# Patient Record
Sex: Female | Born: 1955 | Race: White | Hispanic: No | Marital: Married | Smoking: Never smoker
Health system: Southern US, Community
[De-identification: ages and names within clinical notes are randomized; demographics above are authoritative.]

## PROBLEM LIST (undated history)

## (undated) DIAGNOSIS — E559 Vitamin D deficiency, unspecified: Secondary | ICD-10-CM

## (undated) DIAGNOSIS — I82411 Acute embolism and thrombosis of right femoral vein: Secondary | ICD-10-CM

## (undated) DIAGNOSIS — F32A Depression, unspecified: Secondary | ICD-10-CM

## (undated) DIAGNOSIS — T7840XA Allergy, unspecified, initial encounter: Secondary | ICD-10-CM

## (undated) DIAGNOSIS — F411 Generalized anxiety disorder: Secondary | ICD-10-CM

## (undated) DIAGNOSIS — E7800 Pure hypercholesterolemia, unspecified: Secondary | ICD-10-CM

## (undated) DIAGNOSIS — R7303 Prediabetes: Secondary | ICD-10-CM

## (undated) HISTORY — DX: Generalized anxiety disorder: F41.1

## (undated) HISTORY — DX: Allergy, unspecified, initial encounter: T78.40XA

## (undated) HISTORY — DX: Prediabetes: R73.03

## (undated) HISTORY — DX: Depression, unspecified: F32.A

## (undated) HISTORY — DX: Pure hypercholesterolemia, unspecified: E78.00

## (undated) HISTORY — PX: ABDOMINAL HYSTERECTOMY: SHX81

## (undated) HISTORY — PX: TOTAL HIP ARTHROPLASTY: SHX124

## (undated) HISTORY — DX: Vitamin D deficiency, unspecified: E55.9

## (undated) HISTORY — DX: Acute embolism and thrombosis of right femoral vein: I82.411

---

## 2012-12-02 ENCOUNTER — Ambulatory Visit (INDEPENDENT_AMBULATORY_CARE_PROVIDER_SITE_OTHER): Payer: Managed Care, Other (non HMO) | Admitting: Emergency Medicine

## 2012-12-02 ENCOUNTER — Ambulatory Visit: Payer: Managed Care, Other (non HMO)

## 2012-12-02 VITALS — BP 130/80 | HR 63 | Temp 98.5°F | Resp 18 | Ht 63.5 in | Wt 151.0 lb

## 2012-12-02 DIAGNOSIS — R05 Cough: Secondary | ICD-10-CM

## 2012-12-02 DIAGNOSIS — J209 Acute bronchitis, unspecified: Secondary | ICD-10-CM

## 2012-12-02 DIAGNOSIS — J018 Other acute sinusitis: Secondary | ICD-10-CM

## 2012-12-02 MED ORDER — HYDROCOD POLST-CHLORPHEN POLST 10-8 MG/5ML PO LQCR: 5.0000 mL | Freq: Two times a day (BID) | ORAL | Status: DC | PRN

## 2012-12-02 MED ORDER — PSEUDOEPHEDRINE-GUAIFENESIN ER 60-600 MG PO TB12: 1.0000 | ORAL_TABLET | Freq: Two times a day (BID) | ORAL | Status: AC

## 2012-12-02 MED ORDER — AMOXICILLIN-POT CLAVULANATE 875-125 MG PO TABS: 1.0000 | ORAL_TABLET | Freq: Two times a day (BID) | ORAL | Status: DC

## 2012-12-02 NOTE — Progress Notes (Signed)
[facility] [address] [place] [address] [phone]  Date:  12/02/2012   Name:  Breanna Lewis   DOB:  1955-06-24   MRN:  [id number]  PCP:  No primary provider on file.    Chief Complaint: Cough and Fatigue   History of Present Illness:  Breanna Lewis is a 57 y.o. very pleasant female patient who presents with the following:  One month duration of illness.  Treated previously for sinusitis with keflex and a shot of rocephin.  Initially had nasal congestion and post nasal drainage, fever and chills, and nonproductive cough.  Worse at night.  No nausea or vomiting.  No wheezing or shortness of breath.  Has persistent cough.  Lack of energy, myalgia and athralgias.  No improvement with over the counter medications or other home remedies. Denies other complaint or health concern today.   There are no active problems to display for this patient.   Past Medical History  Diagnosis Date  . Allergy     Past Surgical History  Procedure Laterality Date  . Abdominal hysterectomy      History  Substance Use Topics  . Smoking status: Never Smoker   . Smokeless tobacco: Not on file  . Alcohol Use: No    No family history on file.  No Known Allergies  Medication list has been reviewed and updated.  No current outpatient prescriptions on file prior to visit.   No current facility-administered medications on file prior to visit.    Review of Systems:  As per HPI, otherwise negative.    Physical Examination: Filed Vitals:   12/02/12 1025  BP: 130/80  Pulse: 63  Temp: 98.5 F (36.9 C)  Resp: 18   Filed Vitals:   12/02/12 1025  Height: 5' 3.5" (1.613 m)  Weight: 151 lb (68.493 kg)   Body mass index is 26.33 kg/(m^2). Ideal Body Weight: Weight in (lb) to have BMI = 25: 143.1  GEN: WDWN, NAD, Non-toxic, A & O x 3 HEENT: Atraumatic, Normocephalic. Neck supple. No masses, No LAD. Ears and Nose: No external deformity. CV: RRR, No M/G/R. No  JVD. No thrill. No extra heart sounds. PULM: CTA B, no wheezes, crackles, rhonchi. No retractions. No resp. distress. No accessory muscle use. ABD: S, NT, ND, +BS. No rebound. No HSM. EXTR: No c/c/e NEURO Normal gait.  PSYCH: Normally interactive. Conversant. Not depressed or anxious appearing.  Calm demeanor.    Assessment and Plan: Sinusitis Bronchitis augmentin mucinex tussionex   Signed,  Phillips Odor, MD   [facility] reading (PRIMARY) by  Dr. Dareen Piano.  Chest negative.

## 2012-12-02 NOTE — Patient Instructions (Signed)

## 2017-09-21 ENCOUNTER — Encounter (HOSPITAL_COMMUNITY): Payer: Self-pay | Admitting: Emergency Medicine

## 2017-09-21 ENCOUNTER — Emergency Department (HOSPITAL_COMMUNITY): Payer: BLUE CROSS/BLUE SHIELD

## 2017-09-21 ENCOUNTER — Emergency Department (HOSPITAL_COMMUNITY)
Admission: EM | Admit: 2017-09-21 | Discharge: 2017-09-21 | Disposition: A | Discharge status: Home / Self Care | Payer: BLUE CROSS/BLUE SHIELD | Attending: Emergency Medicine | Admitting: Emergency Medicine

## 2017-09-21 ENCOUNTER — Other Ambulatory Visit: Payer: Self-pay

## 2017-09-21 DIAGNOSIS — R42 Dizziness and giddiness: Secondary | ICD-10-CM

## 2017-09-21 DIAGNOSIS — R739 Hyperglycemia, unspecified: Secondary | ICD-10-CM | POA: Diagnosis not present

## 2017-09-21 DIAGNOSIS — R109 Unspecified abdominal pain: Secondary | ICD-10-CM | POA: Diagnosis not present

## 2017-09-21 LAB — URINALYSIS, ROUTINE W REFLEX MICROSCOPIC
Bilirubin Urine: NEGATIVE
Glucose, UA: 50 mg/dL — AB
HGB URINE DIPSTICK: NEGATIVE
Ketones, ur: NEGATIVE mg/dL
Leukocytes, UA: NEGATIVE
NITRITE: NEGATIVE
PROTEIN: NEGATIVE mg/dL
Specific Gravity, Urine: 1.004 — ABNORMAL LOW (ref 1.005–1.030)
pH: 7 (ref 5.0–8.0)

## 2017-09-21 LAB — CBG MONITORING, ED: GLUCOSE-CAPILLARY: 156 mg/dL — AB (ref 70–99)

## 2017-09-21 LAB — BASIC METABOLIC PANEL
Anion gap: 8 (ref 5–15)
BUN: 11 mg/dL (ref 8–23)
CALCIUM: 9.2 mg/dL (ref 8.9–10.3)
CO2: 28 mmol/L (ref 22–32)
CREATININE: 0.73 mg/dL (ref 0.44–1.00)
Chloride: 105 mmol/L (ref 98–111)
GFR calc Af Amer: >60 mL/min (ref >60)
Glucose, Bld: 183 mg/dL — ABNORMAL HIGH (ref 70–99)
Potassium: 3.7 mmol/L (ref 3.5–5.1)
SODIUM: 141 mmol/L (ref 135–145)

## 2017-09-21 LAB — CBC
HCT: 45.7 % (ref 36.0–46.0)
Hemoglobin: 15 g/dL (ref 12.0–15.0)
MCH: 31.3 pg (ref 26.0–34.0)
MCHC: 32.8 g/dL (ref 30.0–36.0)
MCV: 95.2 fL (ref 78.0–100.0)
PLATELETS: 201 10*3/uL (ref 150–400)
RBC: 4.8 MIL/uL (ref 3.87–5.11)
RDW: 13.3 % (ref 11.5–15.5)
WBC: 7.8 10*3/uL (ref 4.0–10.5)

## 2017-09-21 MED ORDER — SODIUM CHLORIDE 0.9 % IV SOLN
INTRAVENOUS | Status: DC
  Administered 2017-09-21: 12:00:00 via INTRAVENOUS

## 2017-09-21 MED ORDER — MECLIZINE HCL 25 MG PO TABS
25.0000 mg | ORAL_TABLET | Freq: Once | ORAL | Status: AC
  Administered 2017-09-21: 25 mg via ORAL
  Filled 2017-09-21: qty 1

## 2017-09-21 MED ORDER — ONDANSETRON HCL 4 MG/2ML IJ SOLN
4.0000 mg | Freq: Once | INTRAMUSCULAR | Status: AC
  Administered 2017-09-21: 4 mg via INTRAVENOUS
  Filled 2017-09-21: qty 2

## 2017-09-21 NOTE — Discharge Instructions (Addendum)
Drink plenty of fluids Call [facility] tomorrow- you may request [place] location, if it is more convenient Recheck blood sugar with doctor this week

## 2017-09-21 NOTE — ED Triage Notes (Addendum)
Patient here from home with complaints of vertigo. Nausea, vomiting. Vertigo, constant dizziness.

## 2017-09-21 NOTE — ED Provider Notes (Signed)
[facility]-EMERGENCY DEPT Provider Note   CSN: [id number] Arrival date & time: 09/21/17  4098     History   Chief Complaint Chief Complaint  Patient presents with  . Nausea  . Emesis  . Dizziness    HPI Breanna Lewis is a 62 y.o. female.  HPI 61 year old female history of renal colic, ago presents today with onset of dizziness that began while she was eating at [facility].  She vomited.  She continues to complain of some spinning and dizziness feeling off balance.  She denies any visual changes, lateralized weakness, or difficulty with gait.  She has had 2 prior episodes this year.  She reports being seen for follow-up at urgent care.  She has not had any other specialist follow-up but has not been seen by her primary care doctor for this.  Reports prior problems with her left ear as being the source of her problems. She reports a 2-week history of left flank pain.  It is waxing and waning.  She identifies this is similar to pain with her previous kidney stone.  She denies any pain for urination, blood in her urine, or frequency of urination.  Denies fever or chills. Past Medical History:  Diagnosis Date  . Allergy     There are no active problems to display for this patient.   Past Surgical History:  Procedure Laterality Date  . ABDOMINAL HYSTERECTOMY       OB History   None      Home Medications    Prior to Admission medications   Medication Sig Start Date End Date Taking? Authorizing Provider  ALPRAZolam (XANAX XR) 2 MG 24 hr tablet Take 2 mg by mouth 2 (two) times daily. 09/10/17  Yes [provider]  morphine (MS CONTIN) 15 MG 12 hr tablet Take 15 mg by mouth every 12 (twelve) hours.   Yes [provider]  phentermine 15 MG capsule Take 15 mg by mouth every morning. 09/09/17  Yes [provider]    Family History No family history on file.  Social History Social History   Tobacco Use  . Smoking status: Never  Smoker  . Smokeless tobacco: Never Used  Substance Use Topics  . Alcohol use: No  . Drug use: No     Allergies   Patient has no known allergies.   Review of Systems Review of Systems  All other systems reviewed and are negative.    Physical Exam Updated Vital Signs BP 130/87 (BP Location: Right Arm)   Pulse (!) 54   Resp 15   Ht 1.626 m (5\' 4" )   Wt 55.8 kg (123 lb)   SpO2 100%   BMI 21.11 kg/m   Physical Exam  Constitutional: She is oriented to person, place, and time. She appears well-developed and well-nourished.  HENT:  Head: Normocephalic and atraumatic.  Right Ear: External ear normal.  Left Ear: External ear normal.  Eyes: Pupils are equal, round, and reactive to light.  Neck: Normal range of motion. Neck supple.  Cardiovascular: Normal rate, regular rhythm and normal heart sounds.  Pulmonary/Chest: Effort normal and breath sounds normal.  Abdominal: Soft. Bowel sounds are normal.  Musculoskeletal: Normal range of motion.  Neurological: She is alert and oriented to person, place, and time. She displays normal reflexes. No cranial nerve deficit or sensory deficit. She exhibits normal muscle tone. Coordination normal.  Skin: Skin is warm. Capillary refill takes less than 2 seconds.  Psychiatric: She has a  normal mood and affect.  Nursing note and vitals reviewed.    ED Treatments / Results  Labs (all labs ordered are listed, but only abnormal results are displayed) Labs Reviewed  CBG MONITORING, ED - Abnormal; Notable for the following components:      Result Value   Glucose-Capillary 156 (*)    All other components within normal limits  BASIC METABOLIC PANEL  CBC  URINALYSIS, ROUTINE W REFLEX MICROSCOPIC    EKG None  Radiology Ct Head Wo Contrast  Result Date: 09/21/2017 CLINICAL DATA:  Vertigo.  Nausea. EXAM: CT HEAD WITHOUT CONTRAST TECHNIQUE: Contiguous axial images were obtained from the base of the skull through the vertex without  intravenous contrast. COMPARISON:  None. FINDINGS: Brain: No evidence of acute infarction, hemorrhage, hydrocephalus, extra-axial collection or mass lesion/mass effect. Vascular: No hyperdense vessel or unexpected calcification. Skull: Normal. Negative for fracture or focal lesion. Sinuses/Orbits: No acute finding. Other: None. IMPRESSION: 1. Normal appearance of the brain for age. No acute intracranial abnormalities noted. Electronically Signed   By: Signa Kellaylor  Stroud M.D.   On: 09/21/2017 12:28   Ct Renal Stone Study  Result Date: 09/21/2017 CLINICAL DATA:  Bilateral flank pain and vomiting. EXAM: CT ABDOMEN AND PELVIS WITHOUT CONTRAST TECHNIQUE: Multidetector CT imaging of the abdomen and pelvis was performed following the standard protocol without IV contrast. COMPARISON:  CT abdomen pelvis dated June 11, 2017. FINDINGS: Lower chest: No acute abnormality.  Bibasilar atelectasis. Hepatobiliary: No focal liver abnormality is seen. No gallstones, gallbladder wall thickening, or biliary dilatation. Pancreas: Unremarkable. No pancreatic ductal dilatation or surrounding inflammatory changes. Spleen: Normal in size without focal abnormality. Adrenals/Urinary Tract: Adrenal glands are unremarkable. Kidneys are normal, without renal calculi, focal lesion, or hydronephrosis. Bladder is unremarkable. Stomach/Bowel: Stomach is within normal limits. Appendix appears normal. No evidence of bowel wall thickening, distention, or inflammatory changes. Vascular/Lymphatic: No significant vascular findings are present. No enlarged abdominal or pelvic lymph nodes. Reproductive: Status post hysterectomy. No adnexal masses. Other: No free fluid or pneumoperitoneum. Musculoskeletal: No acute or significant osseous findings. Prior bilateral total hip arthroplasties. Moderate facet arthropathy at L4-L5 and L5-S1 with trace L4-L5 anterolisthesis. IMPRESSION: 1.  No acute intra-abdominal process.  No renal or ureteral calculi.  Electronically Signed   By: Obie DredgeWilliam T Derry M.D.   On: 09/21/2017 12:36    Procedures Procedures (including critical care time)  Medications Ordered in ED Medications - No data to display   Initial Impression / Assessment and Plan / ED Course  I have reviewed the triage vital signs and the nursing notes.  Pertinent labs & imaging results that were available during my care of the patient were reviewed by me and considered in my medical decision making (see chart for details).     62 yo female with flank pain and dizziness.  NOrmal neuro exam here. HR low normal with normal bp CT head and abdomen without abnormality Plan checkurine Will give referral to cardiology for bradycardia Discussed return precautions and need for follow up Final Clinical Impressions(s) / ED Diagnoses   Final diagnoses:  Vertigo  Flank pain  Hyperglycemia    ED Discharge Orders    None       Margarita Grizzleay, Cage Gupton, MD 09/21/17 [id number]

## 2018-05-03 ENCOUNTER — Other Ambulatory Visit: Payer: Self-pay

## 2018-05-03 ENCOUNTER — Encounter (HOSPITAL_BASED_OUTPATIENT_CLINIC_OR_DEPARTMENT_OTHER): Payer: Self-pay | Admitting: Emergency Medicine

## 2018-05-03 ENCOUNTER — Emergency Department (HOSPITAL_BASED_OUTPATIENT_CLINIC_OR_DEPARTMENT_OTHER)
Admission: EM | Admit: 2018-05-03 | Discharge: 2018-05-03 | Disposition: A | Discharge status: Home / Self Care | Payer: Medicaid - Out of State | Attending: Emergency Medicine | Admitting: Emergency Medicine

## 2018-05-03 DIAGNOSIS — A09 Infectious gastroenteritis and colitis, unspecified: Secondary | ICD-10-CM | POA: Diagnosis not present

## 2018-05-03 DIAGNOSIS — K2900 Acute gastritis without bleeding: Secondary | ICD-10-CM | POA: Diagnosis not present

## 2018-05-03 DIAGNOSIS — R140 Abdominal distension (gaseous): Secondary | ICD-10-CM | POA: Diagnosis present

## 2018-05-03 DIAGNOSIS — E860 Dehydration: Secondary | ICD-10-CM | POA: Insufficient documentation

## 2018-05-03 LAB — GASTROINTESTINAL PANEL BY PCR, STOOL (REPLACES STOOL CULTURE)
Adenovirus F40/41: NOT DETECTED
Astrovirus: NOT DETECTED
CYCLOSPORA CAYETANENSIS: NOT DETECTED
Campylobacter species: NOT DETECTED
Cryptosporidium: NOT DETECTED
ENTEROPATHOGENIC E COLI (EPEC): NOT DETECTED
ENTEROTOXIGENIC E COLI (ETEC): NOT DETECTED
Entamoeba histolytica: NOT DETECTED
Enteroaggregative E coli (EAEC): NOT DETECTED
Giardia lamblia: NOT DETECTED
NOROVIRUS GI/GII: NOT DETECTED
Plesimonas shigelloides: NOT DETECTED
Rotavirus A: DETECTED — AB
SAPOVIRUS (I, II, IV, AND V): NOT DETECTED
SHIGELLA/ENTEROINVASIVE E COLI (EIEC): NOT DETECTED
Salmonella species: NOT DETECTED
Shiga like toxin producing E coli (STEC): NOT DETECTED
Vibrio cholerae: NOT DETECTED
Vibrio species: NOT DETECTED
Yersinia enterocolitica: NOT DETECTED

## 2018-05-03 LAB — CBC
HCT: 50.9 % — ABNORMAL HIGH (ref 36.0–46.0)
Hemoglobin: 16.2 g/dL — ABNORMAL HIGH (ref 12.0–15.0)
MCH: 30.8 pg (ref 26.0–34.0)
MCHC: 31.8 g/dL (ref 30.0–36.0)
MCV: 96.8 fL (ref 80.0–100.0)
NRBC: 0 % (ref 0.0–0.2)
Platelets: 160 10*3/uL (ref 150–400)
RBC: 5.26 MIL/uL — ABNORMAL HIGH (ref 3.87–5.11)
RDW: 13 % (ref 11.5–15.5)
WBC: 5.2 10*3/uL (ref 4.0–10.5)

## 2018-05-03 LAB — COMPREHENSIVE METABOLIC PANEL
ALK PHOS: 114 U/L (ref 38–126)
ALT: 31 U/L (ref 0–44)
AST: 24 U/L (ref 15–41)
Albumin: 3.8 g/dL (ref 3.5–5.0)
Anion gap: 7 (ref 5–15)
BILIRUBIN TOTAL: 0.6 mg/dL (ref 0.3–1.2)
BUN: 14 mg/dL (ref 8–23)
CHLORIDE: 102 mmol/L (ref 98–111)
CO2: 27 mmol/L (ref 22–32)
CREATININE: 0.77 mg/dL (ref 0.44–1.00)
Calcium: 8.6 mg/dL — ABNORMAL LOW (ref 8.9–10.3)
GFR calc Af Amer: >60 mL/min (ref >60)
GFR calc non Af Amer: >60 mL/min (ref >60)
Glucose, Bld: 90 mg/dL (ref 70–99)
Potassium: 3.6 mmol/L (ref 3.5–5.1)
Sodium: 136 mmol/L (ref 135–145)
Total Protein: 6.5 g/dL (ref 6.5–8.1)

## 2018-05-03 LAB — LIPASE, BLOOD: Lipase: 33 U/L (ref 11–51)

## 2018-05-03 MED ORDER — ONDANSETRON HCL 4 MG PO TABS: 4.0000 mg | ORAL_TABLET | Freq: Three times a day (TID) | ORAL | 0 refills | Status: DC | PRN

## 2018-05-03 MED ORDER — DIPHENOXYLATE-ATROPINE 2.5-0.025 MG PO TABS
2.0000 | ORAL_TABLET | Freq: Once | ORAL | Status: AC
  Administered 2018-05-03: 2 via ORAL
  Filled 2018-05-03: qty 2

## 2018-05-03 MED ORDER — LIDOCAINE VISCOUS HCL 2 % MT SOLN: 15.0000 mL | OROMUCOSAL | 0 refills | Status: DC | PRN

## 2018-05-03 MED ORDER — CIPROFLOXACIN HCL 500 MG PO TABS
500.0000 mg | ORAL_TABLET | Freq: Once | ORAL | Status: AC
  Administered 2018-05-03: 500 mg via ORAL
  Filled 2018-05-03: qty 1

## 2018-05-03 MED ORDER — ONDANSETRON HCL 4 MG/2ML IJ SOLN
4.0000 mg | Freq: Once | INTRAMUSCULAR | Status: AC
  Administered 2018-05-03: 4 mg via INTRAVENOUS
  Filled 2018-05-03: qty 2

## 2018-05-03 MED ORDER — DIPHENOXYLATE-ATROPINE 2.5-0.025 MG PO TABS: 2.0000 | ORAL_TABLET | Freq: Four times a day (QID) | ORAL | 0 refills | Status: DC | PRN

## 2018-05-03 MED ORDER — LACTATED RINGERS IV BOLUS
1000.0000 mL | Freq: Once | INTRAVENOUS | Status: AC
  Administered 2018-05-03: 1000 mL via INTRAVENOUS

## 2018-05-03 MED ORDER — SODIUM CHLORIDE 0.9% FLUSH
3.0000 mL | Freq: Once | INTRAVENOUS | Status: DC
  Filled 2018-05-03: qty 3

## 2018-05-03 MED ORDER — DIPHENOXYLATE-ATROPINE 2.5-0.025 MG PO TABS
1.0000 | ORAL_TABLET | Freq: Once | ORAL | Status: AC
  Administered 2018-05-03: 1 via ORAL
  Filled 2018-05-03: qty 1

## 2018-05-03 MED ORDER — CIPROFLOXACIN HCL 500 MG PO TABS: 500.0000 mg | ORAL_TABLET | Freq: Two times a day (BID) | ORAL | 0 refills | Status: DC

## 2018-05-03 MED ORDER — FAMOTIDINE 20 MG PO TABS: 20.0000 mg | ORAL_TABLET | Freq: Two times a day (BID) | ORAL | 0 refills | Status: DC

## 2018-05-03 NOTE — ED Provider Notes (Signed)
Emergency Department Provider Note   I have reviewed the triage vital signs and the nursing notes.   HISTORY  Chief Complaint Abdominal Pain   HPI Gene Dajani is a 63 y.o. female with PMH as documented below who presents to the ED with 3 days of worsening abdominal distension, diarrhea and nausea. No fever or rash. Her son was recently diagnosed with schistosomiasis. No other associated symptoms. Tried pepto without relief.   No other associated or modifying symptoms.    Past Medical History:  Diagnosis Date  . Allergy     There are no active problems to display for this patient.   Past Surgical History:  Procedure Laterality Date  . ABDOMINAL HYSTERECTOMY      Current Outpatient Rx  . Order #: [id number] Class: Historical Med  . Order #: [id number] Class: Print  . Order #: [id number] Class: Print  . Order #: [id number] Class: Print  . Order #: [id number] Class: Print  . Order #: [id number] Class: Historical Med  . Order #: [id number] Class: Print  . Order #: [id number] Class: Historical Med    Allergies Patient has no known allergies.  No family history on file.  Social History Social History   Tobacco Use  . Smoking status: Never Smoker  . Smokeless tobacco: Never Used  Substance Use Topics  . Alcohol use: No  . Drug use: No    Review of Systems  All other systems negative except as documented in the HPI. All pertinent positives and negatives as reviewed in the HPI. ____________________________________________   PHYSICAL EXAM:  VITAL SIGNS: ED Triage Vitals  Enc Vitals Group     BP 05/03/18 0222 (!) 140/96     Pulse Rate 05/03/18 0222 78     Resp 05/03/18 0222 18     Temp 05/03/18 0222 98 F (36.7 C)     Temp Source 05/03/18 0222 Oral     SpO2 05/03/18 0222 100 %     Weight 05/03/18 0223 125 lb (56.7 kg)     Height 05/03/18 0223 5\' 4"  (1.626 m)    Constitutional: Alert and oriented. Well appearing and in no acute distress. Eyes: Conjunctivae  are normal. PERRL. EOMI. Head: Atraumatic. Nose: No congestion/rhinnorhea. Mouth/Throat: Mucous membranes are moist.  Oropharynx non-erythematous. Neck: No stridor.  No meningeal signs.   Cardiovascular: Normal rate, regular rhythm. Good peripheral circulation. Grossly normal heart sounds.   Respiratory: Normal respiratory effort.  No retractions. Lungs CTAB. Gastrointestinal: Soft and nontender. Mild distention.  Musculoskeletal: No lower extremity tenderness nor edema. No gross deformities of extremities. Neurologic:  Normal speech and language. No gross focal neurologic deficits are appreciated.  Skin:  Skin is warm, dry and intact. No rash noted.   ____________________________________________   LABS (all labs ordered are listed, but only abnormal results are displayed)  Labs Reviewed  COMPREHENSIVE METABOLIC PANEL - Abnormal; Notable for the following components:      Result Value   Calcium 8.6 (*)    All other components within normal limits  CBC - Abnormal; Notable for the following components:   RBC 5.26 (*)    Hemoglobin 16.2 (*)    HCT 50.9 (*)    All other components within normal limits  OVA + PARASITE EXAM  GASTROINTESTINAL PANEL BY PCR, STOOL (REPLACES STOOL CULTURE)  LIPASE, BLOOD  URINALYSIS, ROUTINE W REFLEX MICROSCOPIC   ____________________________________________   INITIAL IMPRESSION / ASSESSMENT AND PLAN / ED COURSE  Dry on exam. hemoconcentrated on labs. Will get stool sample. Treat for bacterial infection in  lieu of results. Iv fluids/zofran. Hold on diarrhea meds for time being.   After approximately 15 episodes of diarrhea, lomotil seems to be helping. Will treat for reflux/infectious diarrhea. pcp follow up. Pending parasite/bacterial studies.   Pertinent labs & imaging results that were available during my care of the patient were reviewed by me and considered in my medical decision making (see chart for  details).  ____________________________________________  FINAL CLINICAL IMPRESSION(S) / ED DIAGNOSES  Final diagnoses:  Acute gastritis without hemorrhage, unspecified gastritis type  Diarrhea of infectious origin  Dehydration     MEDICATIONS GIVEN DURING THIS VISIT:  Medications  sodium chloride flush (NS) 0.9 % injection 3 mL (3 mLs Intravenous Not Given 05/03/18 0433)  ciprofloxacin (CIPRO) tablet 500 mg (has no administration in time range)  lactated ringers bolus 1,000 mL (1,000 mLs Intravenous New Bag/Given 05/03/18 0455)  ondansetron (ZOFRAN) injection 4 mg (4 mg Intravenous Given 05/03/18 0446)  diphenoxylate-atropine (LOMOTIL) 2.5-0.025 MG per tablet 1 tablet (1 tablet Oral Given 05/03/18 0504)  diphenoxylate-atropine (LOMOTIL) 2.5-0.025 MG per tablet 2 tablet (2 tablets Oral Given 05/03/18 0553)     NEW OUTPATIENT MEDICATIONS STARTED DURING THIS VISIT:  New Prescriptions   CIPROFLOXACIN (CIPRO) 500 MG TABLET    Take 1 tablet (500 mg total) by mouth 2 (two) times daily.   DIPHENOXYLATE-ATROPINE (LOMOTIL) 2.5-0.025 MG TABLET    Take 2 tablets by mouth 4 (four) times daily as needed for diarrhea or loose stools.   FAMOTIDINE (PEPCID) 20 MG TABLET    Take 1 tablet (20 mg total) by mouth 2 (two) times daily.   LIDOCAINE (XYLOCAINE) 2 % SOLUTION    Use as directed 15 mLs in the mouth or throat as needed for mouth pain.   ONDANSETRON (ZOFRAN) 4 MG TABLET    Take 1 tablet (4 mg total) by mouth every 8 (eight) hours as needed for nausea or vomiting.    Note:  This note was prepared with assistance of Dragon voice recognition software. Occasional wrong-word or sound-a-like substitutions may have occurred due to the inherent limitations of voice recognition software.   Deisy Ozbun, Barbara Cower, MD 05/03/18 [phone]

## 2018-05-03 NOTE — ED Triage Notes (Signed)
Pt c/o 10/10 abd pain with nausea and multiple loose stool since yesterday, denies vomiting, fever, chills or any urinary symptoms.

## 2018-05-03 NOTE — ED Notes (Signed)
Pt given BSC due to inability to hold her bowels en route to bathroom.

## 2018-05-03 NOTE — ED Notes (Signed)
Pt reports 2 days ago she started having diarrhea. Became worse today, multiple episodes associated with nausea and generalized abdominal cramping. Denies fevers. Reports her son was also sick with GI symptoms. Reporting watery diarrhea without evidence of blood. No recent antibiotics.

## 2018-05-07 LAB — OVA + PARASITE EXAM

## 2018-05-07 LAB — O&P RESULT

## 2019-07-16 IMAGING — CT CT RENAL STONE PROTOCOL
2 of 4 series · 16 of 46 positions shown, 18 images · non-contrast
Comparison: CT abdomen pelvis dated June 11, 2017.

CLINICAL DATA: Bilateral flank pain and vomiting.

EXAM:
CT ABDOMEN AND PELVIS WITHOUT CONTRAST
TECHNIQUE: Multidetector CT imaging of the abdomen and pelvis was performed
following the standard protocol without IV contrast.

[Series 2: axial st · axial · 0.80mm/px · z∈[-88,+346]mm · 13 of 99 slices shown, 15 images]
[im 6/99  soft-tissue]
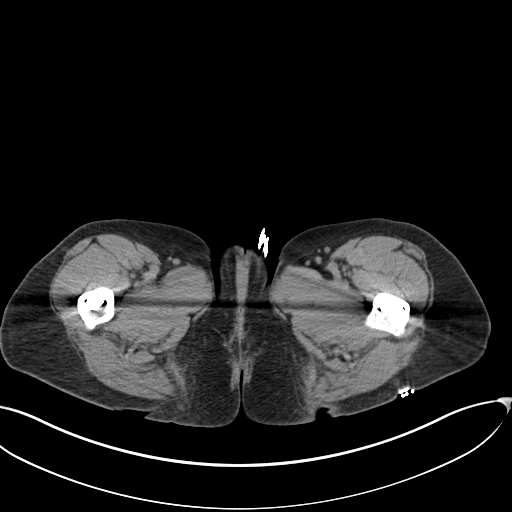
[im 6/99  bone]
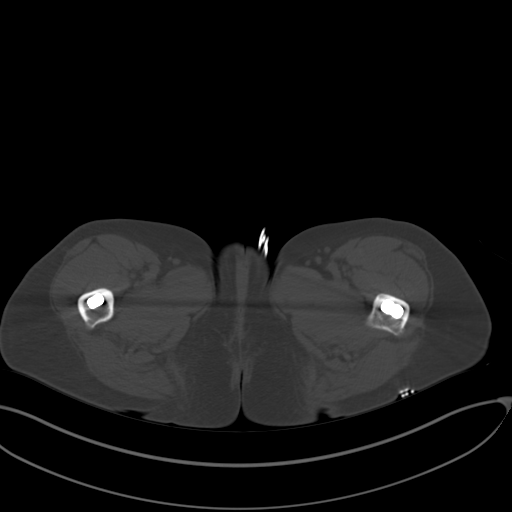
[im 11/99  soft-tissue]
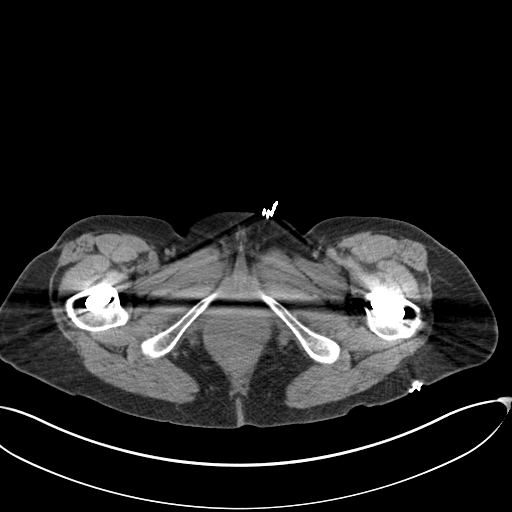
[im 22/99  soft-tissue]
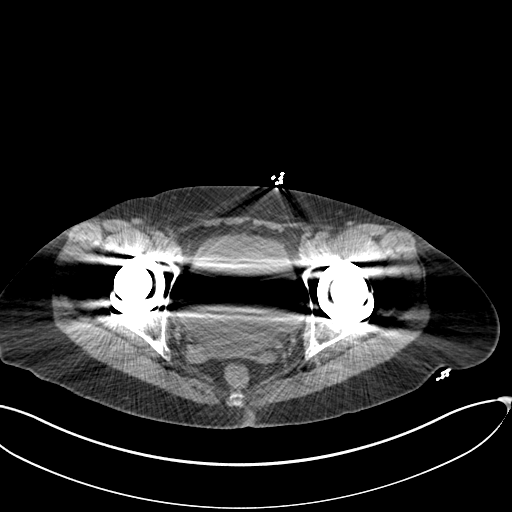
[im 28/99  soft-tissue]
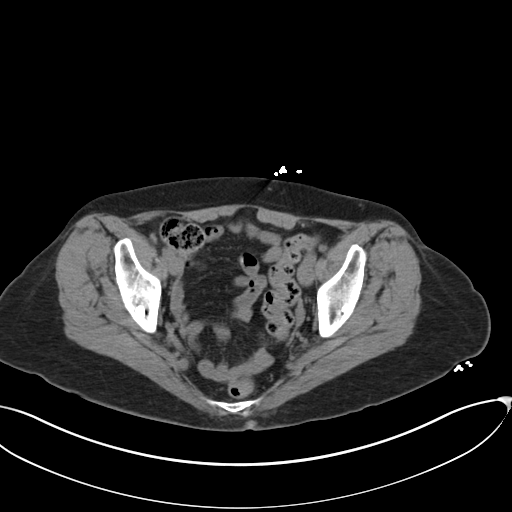
[im 33/99  soft-tissue]
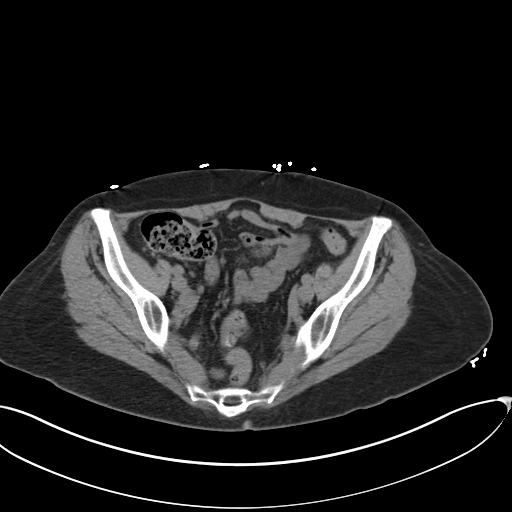
[im 44/99  soft-tissue]
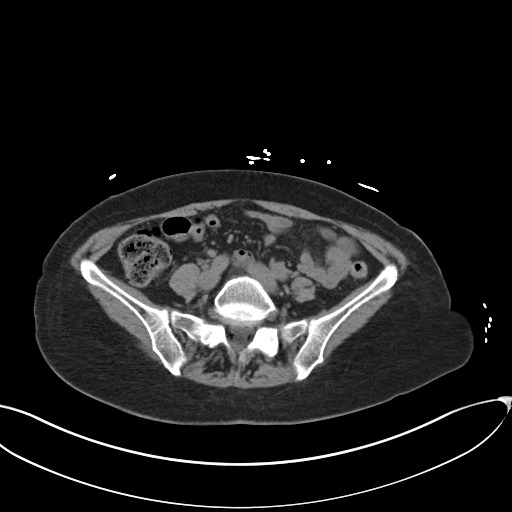
[im 50/99  soft-tissue]
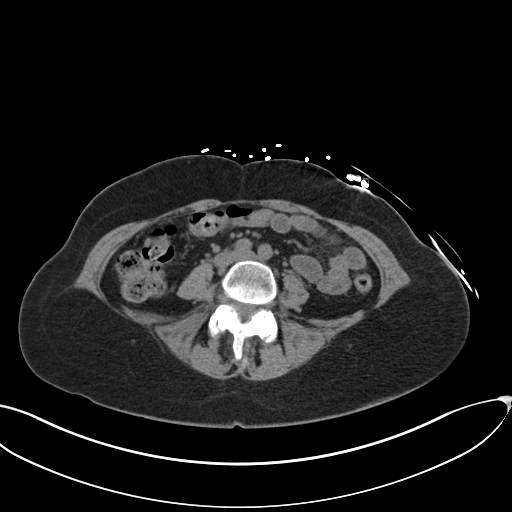
[im 55/99  soft-tissue]
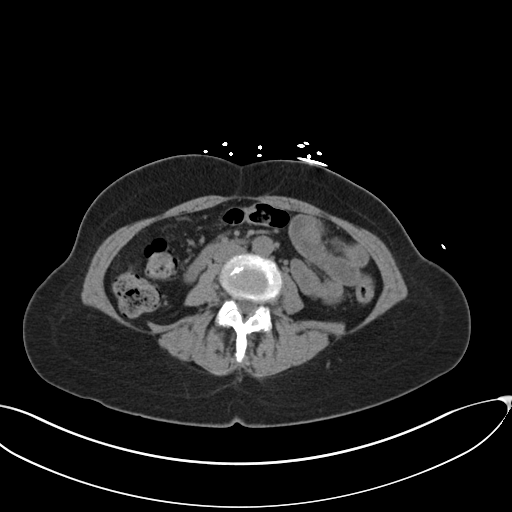
[im 66/99  soft-tissue]
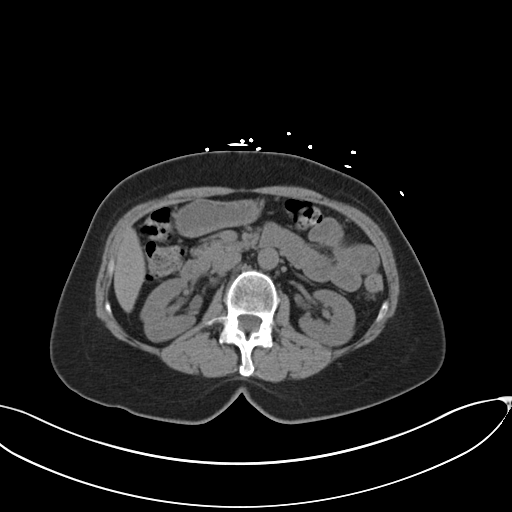
[im 66/99  bone]
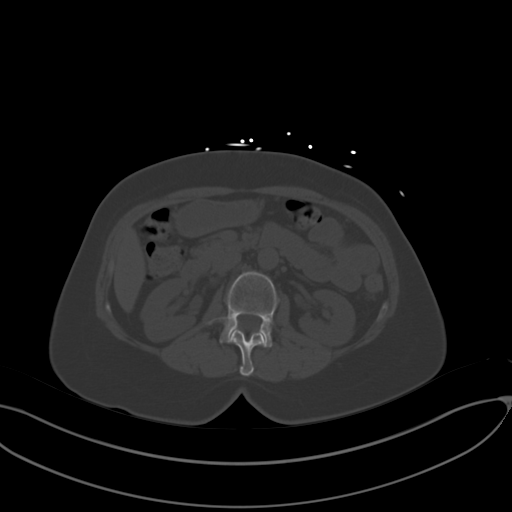
[im 71/99  soft-tissue]
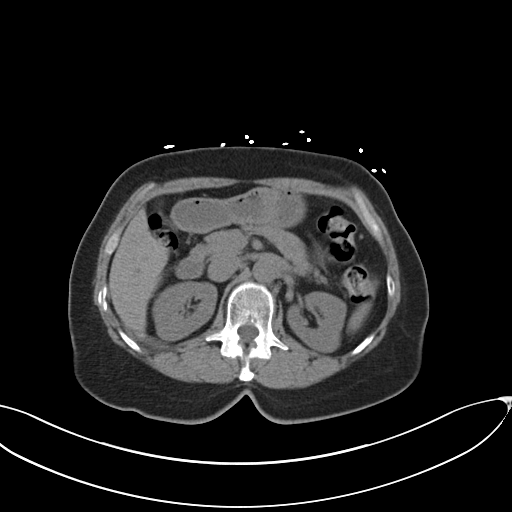
[im 77/99  soft-tissue]
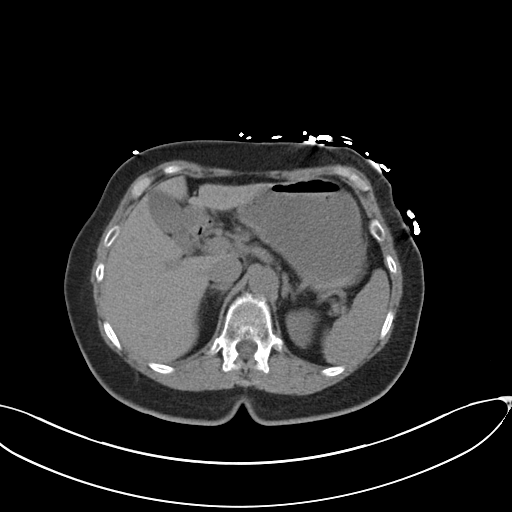
[im 88/99  soft-tissue]
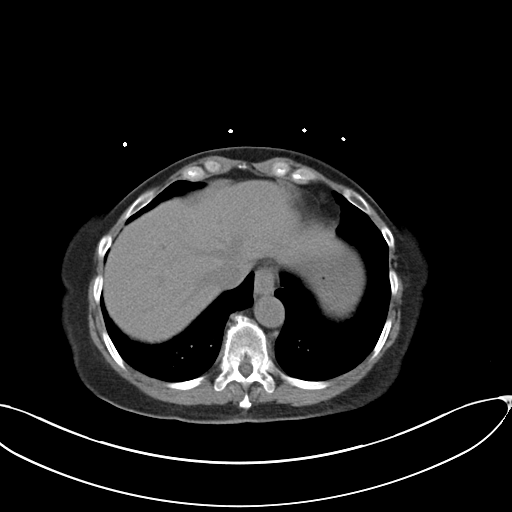
[im 93/99  soft-tissue]
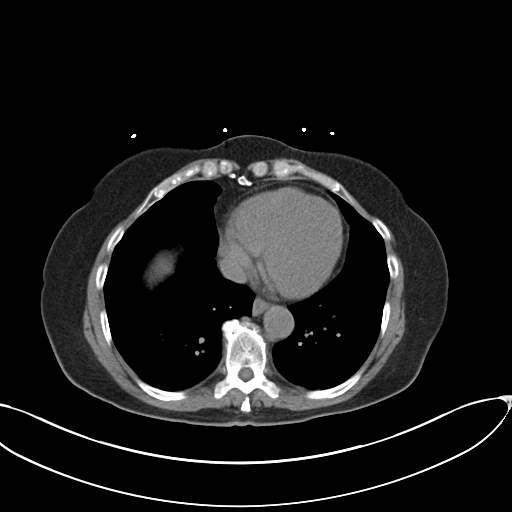

[Series 4: coronal · coronal · 0.83mm/px · 3 of 146 slices shown]
[im 49/146  soft-tissue]
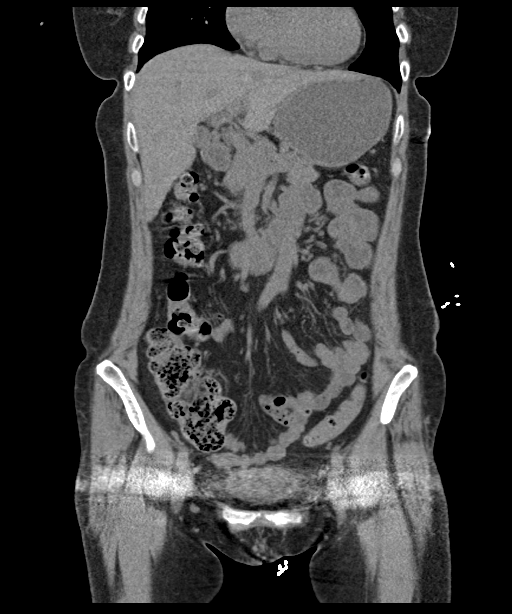
[im 65/146  soft-tissue]
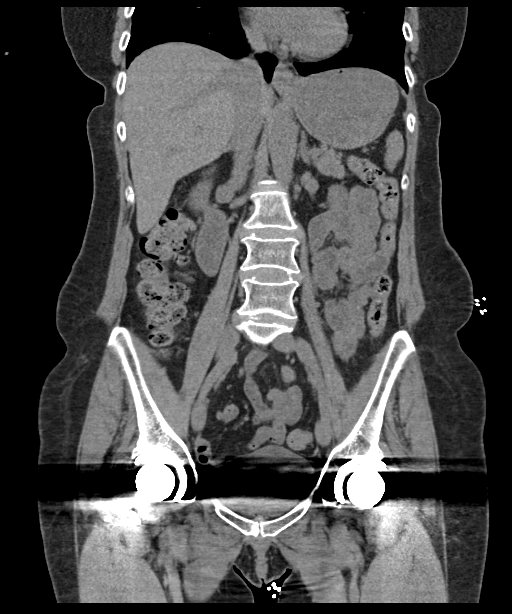
[im 81/146  soft-tissue]
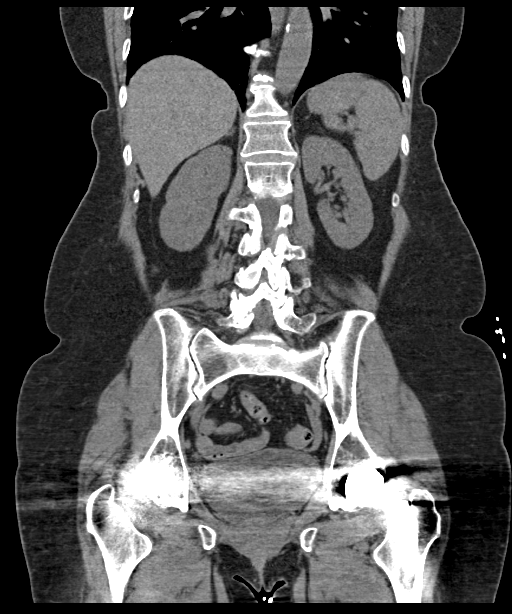

[16 of 46 positions shown; findings below may reference images not displayed]

FINDINGS: Lower chest: No acute abnormality.  Bibasilar atelectasis.

Hepatobiliary: No focal liver abnormality is seen. No gallstones,
gallbladder wall thickening, or biliary dilatation.

Pancreas: Unremarkable. No pancreatic ductal dilatation or
surrounding inflammatory changes.

Spleen: Normal in size without focal abnormality.

Adrenals/Urinary Tract: Adrenal glands are unremarkable. Kidneys are
normal, without renal calculi, focal lesion, or hydronephrosis.
Bladder is unremarkable.

Stomach/Bowel: Stomach is within normal limits. Appendix appears
normal. No evidence of bowel wall thickening, distention, or
inflammatory changes.

Vascular/Lymphatic: No significant vascular findings are present. No
enlarged abdominal or pelvic lymph nodes.

Reproductive: Status post hysterectomy. No adnexal masses.

Other: No free fluid or pneumoperitoneum.

Musculoskeletal: No acute or significant osseous findings. Prior
bilateral total hip arthroplasties. Moderate facet arthropathy at
L4-L5 and L5-S1 with trace L4-L5 anterolisthesis.
IMPRESSION: 1.  No acute intra-abdominal process.  No renal or ureteral calculi.

## 2019-07-16 IMAGING — CT CT HEAD W/O CM
3 series · 14 of 47 positions shown, 16 images · non-contrast
Comparison: None.

CLINICAL DATA: Vertigo.  Nausea.

EXAM:
CT HEAD WITHOUT CONTRAST
TECHNIQUE: Contiguous axial images were obtained from the base of the skull
through the vertex without intravenous contrast.

[Series 2: head wo · axial · 0.47mm/px · z∈[+647,+772]mm · 8 of 30 slices shown, 10 images]
[im 3/30  brain]
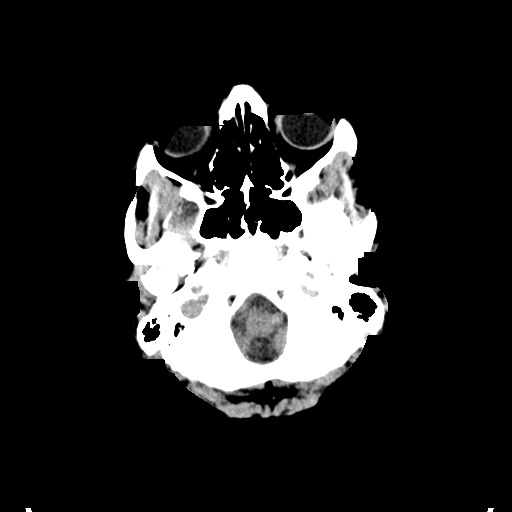
[im 3/30  bone]
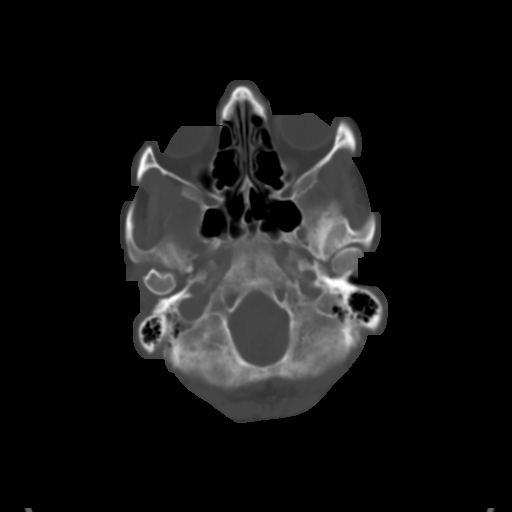
[im 7/30  brain]
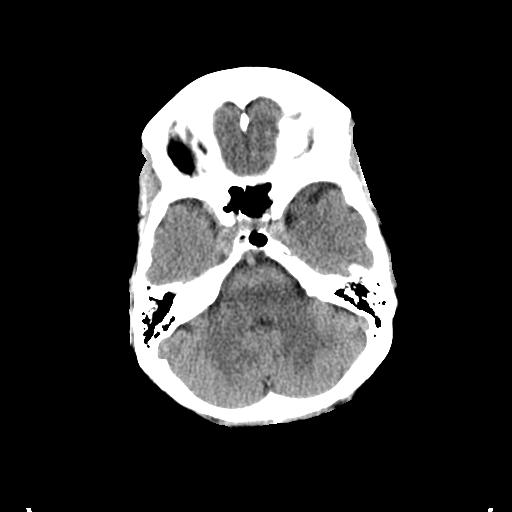
[im 10/30  brain]
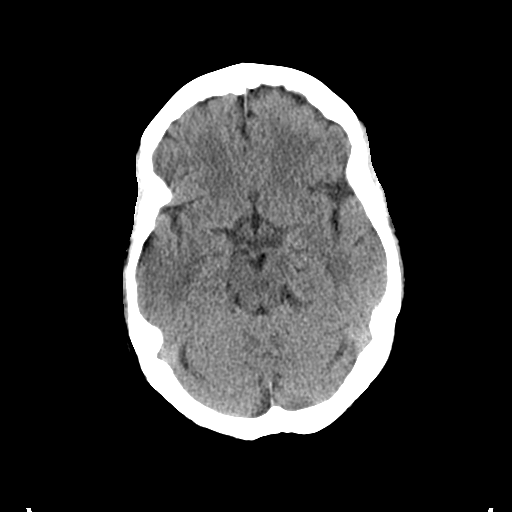
[im 14/30  brain]
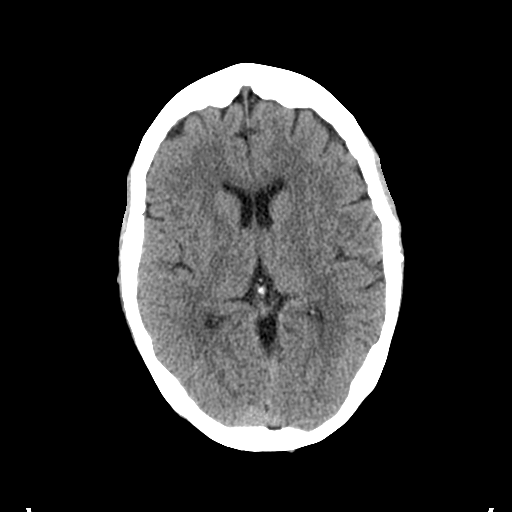
[im 17/30  brain]
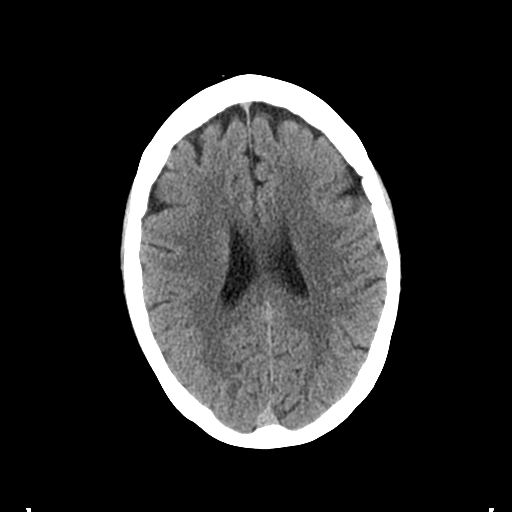
[im 17/30  bone]
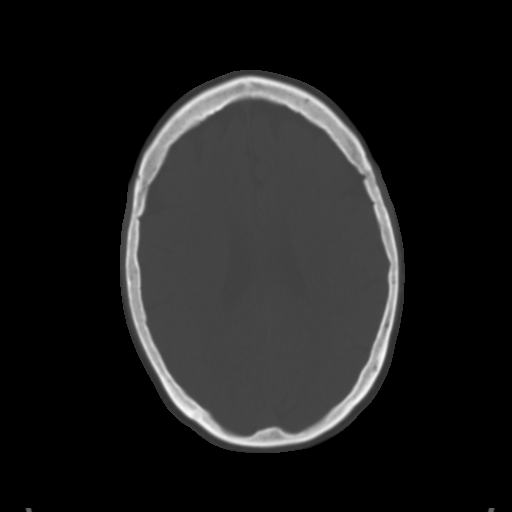
[im 21/30  brain]
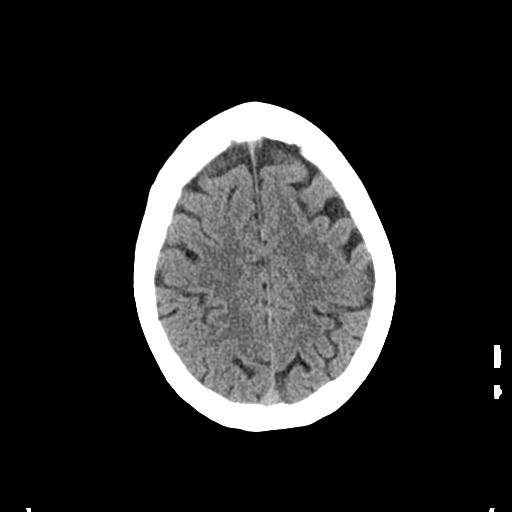
[im 24/30  brain]
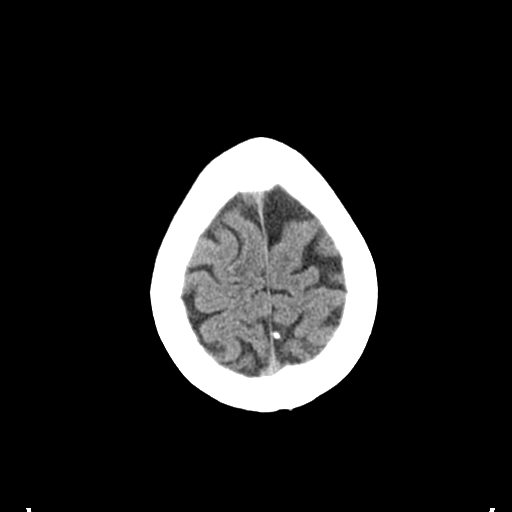
[im 28/30  brain]
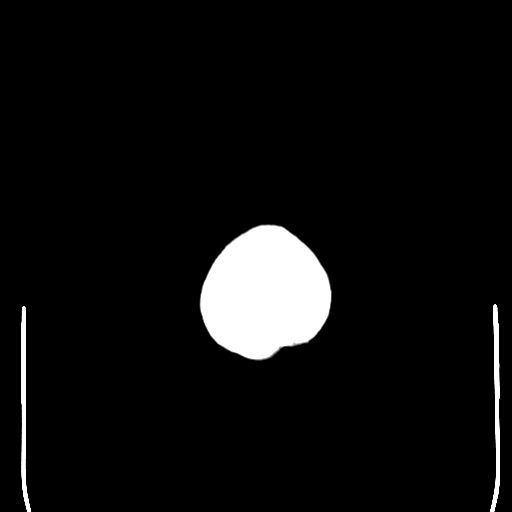

[Series 4: coronal soft tissue · coronal · 0.28mm/px · 3 of 82 slices shown]
[im 28/82  brain]
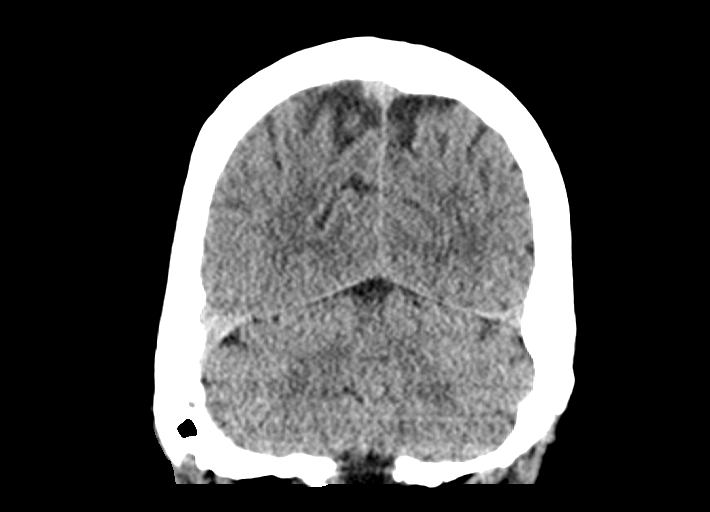
[im 37/82  brain]
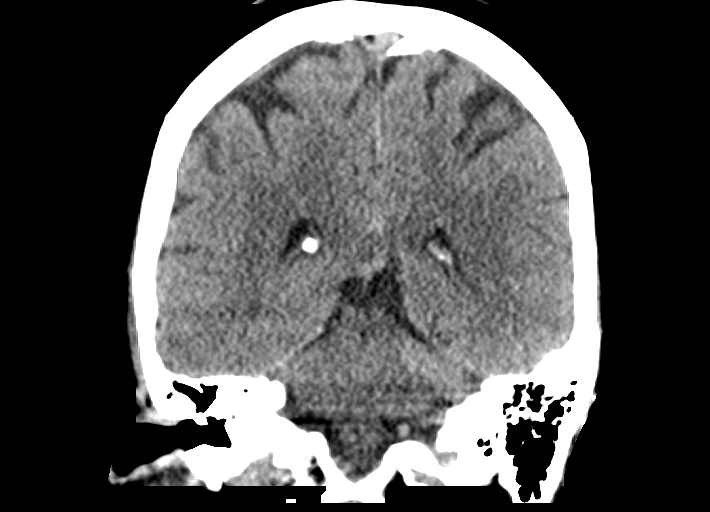
[im 46/82  brain]
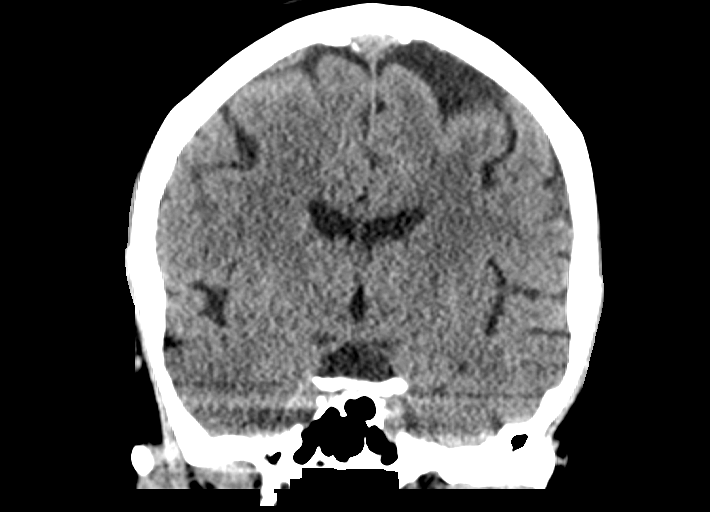

[Series 5: sagittal soft tissue · sagittal · 0.29mm/px · 3 of 68 slices shown]
[im 23/68  brain]
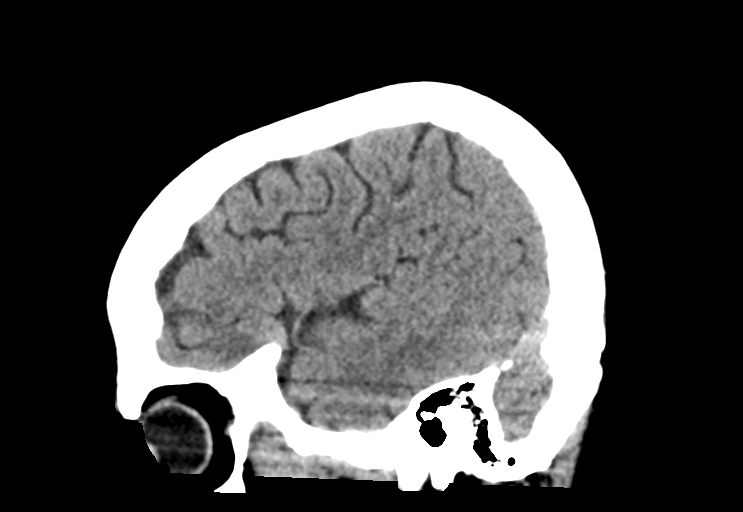
[im 34/68  brain]
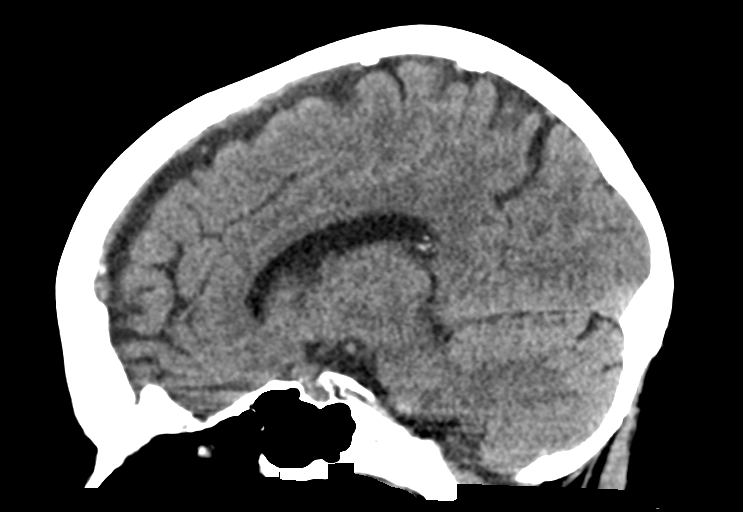
[im 45/68  brain]
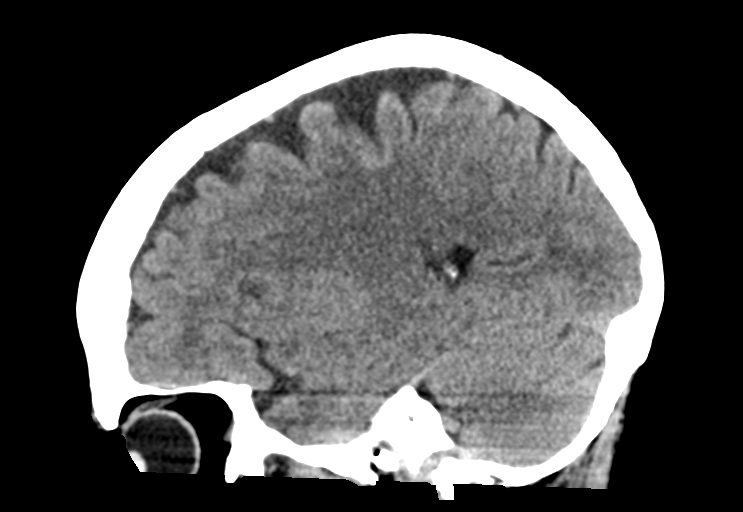

[14 of 47 positions shown; findings below may reference images not displayed]

FINDINGS: Brain: No evidence of acute infarction, hemorrhage, hydrocephalus,
extra-axial collection or mass lesion/mass effect.

Vascular: No hyperdense vessel or unexpected calcification.

Skull: Normal. Negative for fracture or focal lesion.

Sinuses/Orbits: No acute finding.

Other: None.
IMPRESSION: 1. Normal appearance of the brain for age. No acute intracranial
abnormalities noted.

## 2022-11-06 ENCOUNTER — Encounter: Payer: Self-pay | Admitting: Internal Medicine

## 2022-11-08 ENCOUNTER — Encounter: Payer: Self-pay | Admitting: Internal Medicine

## 2022-11-08 ENCOUNTER — Ambulatory Visit: Payer: 59 | Attending: Internal Medicine | Admitting: Internal Medicine

## 2022-11-08 VITALS — BP 130/90 | HR 68 | Ht 63.0 in | Wt 144.6 lb

## 2022-11-08 DIAGNOSIS — R000 Tachycardia, unspecified: Secondary | ICD-10-CM

## 2022-11-08 DIAGNOSIS — I471 Supraventricular tachycardia, unspecified: Secondary | ICD-10-CM | POA: Diagnosis not present

## 2022-11-08 DIAGNOSIS — R42 Dizziness and giddiness: Secondary | ICD-10-CM

## 2022-11-08 NOTE — Progress Notes (Signed)
Cardiology Office Note  Date: 11/08/2022   ID: Terrilyn M Murnane, DOB Jul 02, 1955, MRN [id number]  PCP:  Patient, No Pcp Per  Cardiologist:  Marjo Bicker, MD Electrophysiologist:  None   History of Present Illness: Breanna Lewis is a 67 y.o. female with no PMH was referred to cardiology clinic for evaluation of tachycardia.   She follows up with ENT for inner ear issues, has vertigo.  She reports having dizziness and describes this as room spinning sensation.  She underwent event monitor with a diagnosis of dizziness, I do not have the event monitor report today but she was told she had elevated heart rates at night.  Upon chart review, she had visit diagnosis of dizziness and SVT from 09/02/2022 office visit.  She reports no lightheadedness.  No palpitations.  She was started on diazepam at night for elevated heart rates.  No chest pain, DOE, orthopnea, PND or leg swelling.  Never had any heart issues.  Never was diagnosed with OSA.  She snores at night and her husband did not notice any apneas.  Past Medical History:  Diagnosis Date   Allergy    Depression    GAD (generalized anxiety disorder)    Hypercholesterolemia    Prediabetes    Right femoral vein DVT (HCC)    Vitamin D deficiency     Past Surgical History:  Procedure Laterality Date   ABDOMINAL HYSTERECTOMY     TOTAL HIP ARTHROPLASTY      Current Outpatient Medications  Medication Sig Dispense Refill   diazepam (VALIUM) 2 MG tablet Take 1 tablet by mouth 2 (two) times daily.     No current facility-administered medications for this visit.   Allergies:  Patient has no known allergies.   Social History: The patient  reports that she has never smoked. She has never used smokeless tobacco. She reports that she does not drink alcohol and does not use drugs.   Family History: The patient's family history includes Colon cancer in her father; Heart disease in her brother.   ROS:  Please see the history of  present illness. Otherwise, complete review of systems is positive for none  All other systems are reviewed and negative.   Physical Exam: VS:  BP (!) 130/90   Pulse 68   Ht 5\' 3"  (1.6 m)   Wt 144 lb 9.6 oz (65.6 kg)   SpO2 95%   BMI 25.61 kg/m , BMI Body mass index is 25.61 kg/m.  Wt Readings from Last 3 Encounters:  11/08/22 144 lb 9.6 oz (65.6 kg)  05/03/18 125 lb (56.7 kg)  09/21/17 123 lb (55.8 kg)    General: Patient appears comfortable at rest. HEENT: Conjunctiva and lids normal, oropharynx clear with moist mucosa. Neck: Supple, no elevated JVP or carotid bruits, no thyromegaly. Lungs: Clear to auscultation, nonlabored breathing at rest. Cardiac: Regular rate and rhythm, no S3 or significant systolic murmur, no pericardial rub. Abdomen: Soft, nontender, no hepatomegaly, bowel sounds present, no guarding or rebound. Extremities: No pitting edema, distal pulses 2+. Skin: Warm and dry. Musculoskeletal: No kyphosis. Neuropsychiatric: Alert and oriented x3, affect grossly appropriate.  Recent Labwork: No results found for requested labs within last 365 days.  No results found for: "CHOL", "TRIG", "HDL", "CHOLHDL", "VLDL", "LDLCALC", "LDLDIRECT"  Other Studies Reviewed Today:   Assessment and Plan:  Documentation of SVT Vertigo   -Patient was seen by ENT for inner ear issues/vertigo and obtained event monitor for dizziness/vertigo. I do not have  the event monitor report/strips to review.  But patient never had any palpitations or lightheadedness.  The only reason that she has dizziness is from vertigo (describes as room spinning sensation).  I would not recommend any rate controlling agents in the absence of symptoms. she was started on diazepam at night based on the event monitor findings (self-reported that she had high heart rates at night). I instructed her to stop diazepam. -Obtain event monitor results from the PCP       Medication Adjustments/Labs and Tests  Ordered: Current medicines are reviewed at length with the patient today.  Concerns regarding medicines are outlined above.    Disposition:  Follow up prn  Signed Larie Mathes Verne Spurr, MD, 11/08/2022 11:02 AM    [facility] [address] [place], [place], [place] [address]

## 2022-11-08 NOTE — Patient Instructions (Signed)
Medication Instructions:  Your physician recommends that you continue on your current medications as directed. Please refer to the Current Medication list given to you today.  *If you need a refill on your cardiac medications before your next appointment, please call your pharmacy*   Lab Work: None If you have labs (blood work) drawn today and your tests are completely normal, you will receive your results only by: MyChart Message (if you have MyChart) OR A paper copy in the mail If you have any lab test that is abnormal or we need to change your treatment, we will call you to review the results.   Testing/Procedures: None   Follow-Up: At [facility], you and your health needs are our priority.  As part of our continuing mission to provide you with exceptional heart care, we have created designated Provider Care Teams.  These Care Teams include your primary Cardiologist (physician) and Advanced Practice Providers (APPs -  Physician Assistants and Nurse Practitioners) who all work together to provide you with the care you need, when you need it.  We recommend signing up for the patient portal called "MyChart".  Sign up information is provided on this After Visit Summary.  MyChart is used to connect with patients for Virtual Visits (Telemedicine).  Patients are able to view lab/test results, encounter notes, upcoming appointments, etc.  Non-urgent messages can be sent to your provider as well.   To learn more about what you can do with MyChart, go to [email or web address].    Your next appointment:   Follow up as needed.    Provider:   Vishnu Mallipeddi, MD    Other Instructions    

## 2024-01-29 ENCOUNTER — Encounter: Payer: Self-pay | Admitting: Internal Medicine

## 2024-01-29 ENCOUNTER — Ambulatory Visit: Attending: Internal Medicine | Admitting: Internal Medicine

## 2024-01-29 VITALS — BP 110/80 | HR 66 | Ht 63.0 in | Wt 150.6 lb

## 2024-01-29 DIAGNOSIS — I471 Supraventricular tachycardia, unspecified: Secondary | ICD-10-CM | POA: Diagnosis not present

## 2024-01-29 NOTE — Patient Instructions (Signed)
 Medication Instructions:  Your physician recommends that you continue on your current medications as directed. Please refer to the Current Medication list given to you today.  *If you need a refill on your cardiac medications before your next appointment, please call your pharmacy*  Lab Work: None If you have labs (blood work) drawn today and your tests are completely normal, you will receive your results only by: MyChart Message (if you have MyChart) OR A paper copy in the mail If you have any lab test that is abnormal or we need to change your treatment, we will call you to review the results.  Testing/Procedures: None  Follow-Up: At [facility], you and your health needs are our priority.  As part of our continuing mission to provide you with exceptional heart care, our providers are all part of one team.  This team includes your primary Cardiologist (physician) and Advanced Practice Providers or APPs (Physician Assistants and Nurse Practitioners) who all work together to provide you with the care you need, when you need it.  Your next appointment:    Follow up as needed.  Provider:   You may see Vishnu P Mallipeddi, MD or the following Advanced Practice Provider on your designated Care Team:   Almarie Crate, NP    We recommend signing up for the patient portal called MyChart.  Sign up information is provided on this After Visit Summary.  MyChart is used to connect with patients for Virtual Visits (Telemedicine).  Patients are able to view lab/test results, encounter notes, upcoming appointments, etc.  Non-urgent messages can be sent to your provider as well.   To learn more about what you can do with MyChart, go to [email or web address].   Other Instructions

## 2024-01-29 NOTE — Progress Notes (Signed)
 Cardiology Office Note  Date: 01/29/2024   ID: Breanna Lewis, DOB 02/17/1956, MRN [id number]  PCP:  Austin Mutton, MD  Cardiologist:  Diannah SHAUNNA Maywood, MD Electrophysiologist:  None   History of Present Illness: Breanna Lewis is a 68 y.o. female  Aug 2024: She follows up with ENT for inner ear issues, has vertigo.  She reports having dizziness and describes this as room spinning sensation.  She underwent event monitor with a diagnosis of dizziness, I do not have the event monitor report today but she was told she had elevated heart rates at night.  Upon chart review, she had visit diagnosis of dizziness and SVT from 09/02/2022 office visit.  She reports no lightheadedness.  No palpitations.  She was started on diazepam at night for elevated heart rates.  No chest pain, DOE, orthopnea, PND or leg swelling.  Never had any heart issues.  Never was diagnosed with OSA.  She snores at night and her husband did not notice any apneas.  Nov 2025: No angina or DOE.  No palpitations.  No dizziness, syncope, presyncope.  Doing well overall.  Past Medical History:  Diagnosis Date   Allergy    Depression    GAD (generalized anxiety disorder)    Hypercholesterolemia    Prediabetes    Right femoral vein DVT (HCC)    Vitamin D deficiency     Past Surgical History:  Procedure Laterality Date   ABDOMINAL HYSTERECTOMY     TOTAL HIP ARTHROPLASTY      Current Outpatient Medications  Medication Sig Dispense Refill   diazepam (VALIUM) 2 MG tablet Take 1 tablet by mouth 2 (two) times daily.     Multiple Vitamin (MULTIVITAMIN WITH MINERALS) TABS tablet Take 1 tablet by mouth daily.     No current facility-administered medications for this visit.   Allergies:  Patient has no known allergies.   Social History: The patient  reports that she has never smoked. She has never used smokeless tobacco. She reports that she does not drink alcohol and does not use drugs.   Family History: The patient's  family history includes Colon cancer in her father; Heart disease in her brother.   ROS:  Please see the history of present illness. Otherwise, complete review of systems is positive for none  All other systems are reviewed and negative.   Physical Exam: VS:  BP 110/80   Pulse 66   Ht 5' 3 (1.6 m)   Wt 150 lb 9.6 oz (68.3 kg)   SpO2 96%   BMI 26.68 kg/m , BMI Body mass index is 26.68 kg/m.  Wt Readings from Last 3 Encounters:  01/29/24 150 lb 9.6 oz (68.3 kg)  11/08/22 144 lb 9.6 oz (65.6 kg)  05/03/18 125 lb (56.7 kg)    General: Patient appears comfortable at rest. HEENT: Conjunctiva and lids normal, oropharynx clear with moist mucosa. Neck: Supple, no elevated JVP or carotid bruits, no thyromegaly. Lungs: Clear to auscultation, nonlabored breathing at rest. Cardiac: Regular rate and rhythm, no S3 or significant systolic murmur, no pericardial rub. Abdomen: Soft, nontender, no hepatomegaly, bowel sounds present, no guarding or rebound. Extremities: No pitting edema, distal pulses 2+. Skin: Warm and dry. Musculoskeletal: No kyphosis. Neuropsychiatric: Alert and oriented x3, affect grossly appropriate.  Recent Labwork: No results found for requested labs within last 365 days.  No results found for: CHOL, TRIG, HDL, CHOLHDL, VLDL, LDLCALC, LDLDIRECT  Other Studies Reviewed Today:   Assessment and Plan:  Vertigo: Management per PCP.  Documentation of SVT on monitor: Multiple unsuccessful attempts in trying to obtain the event monitor from PCP.  In the absence of palpitations, would not treat any paroxysmal SVT.   20 minutes spent in reviewing prior records, discussion and documentation.  Medication Adjustments/Labs and Tests Ordered: Current medicines are reviewed at length with the patient today.  Concerns regarding medicines are outlined above.    Disposition:  Follow up prn  Signed Winford Hehn Arleta Maywood, MD, 01/29/2024 10:07 AM    [facility] [address] [place], [place], [place] [address]
